# Patient Record
Sex: Female | Born: 2000 | Race: White | Hispanic: No | Marital: Single
Health system: Western US, Academic
[De-identification: ages and names within clinical notes are randomized; demographics above are authoritative.]

## PROBLEM LIST (undated history)

## (undated) DIAGNOSIS — I350 Nonrheumatic aortic (valve) stenosis: Secondary | ICD-10-CM

## (undated) DIAGNOSIS — Z972 Presence of dental prosthetic device (complete) (partial): Secondary | ICD-10-CM

## (undated) DIAGNOSIS — G43909 Migraine, unspecified, not intractable, without status migrainosus: Secondary | ICD-10-CM

## (undated) DIAGNOSIS — K219 Gastro-esophageal reflux disease without esophagitis: Secondary | ICD-10-CM

## (undated) DIAGNOSIS — Q231 Congenital insufficiency of aortic valve: Secondary | ICD-10-CM

## (undated) DIAGNOSIS — Q249 Congenital malformation of heart, unspecified: Secondary | ICD-10-CM

## (undated) HISTORY — PX: TOOTH EXTRACTION: SUR596

---
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
## 2021-01-30 NOTE — Telephone Encounter (Signed)
RN telephone both patient and mother and left a voice message to call [facility] at [phone] and RN also text messaged patient at [phone] using Outlook email with the following message:    Please call [facility] to Schedule an appointment with Cardiology at [phone].    Thanks,  Randall An RN, BSN (she/her)  Nurse Clinician  Cardiology Clinic  [facility]  [address]   [place], [place] [address]    Randall An RN, BSN  Cardiology Clinic

## 2021-02-15 DIAGNOSIS — F419 Anxiety disorder, unspecified: Secondary | ICD-10-CM | POA: Insufficient documentation

## 2021-03-07 ENCOUNTER — Other Ambulatory Visit: Payer: Self-pay

## 2021-03-07 ENCOUNTER — Encounter: Payer: Self-pay | Admitting: Gastroenterology

## 2021-03-07 ENCOUNTER — Ambulatory Visit (INDEPENDENT_AMBULATORY_CARE_PROVIDER_SITE_OTHER): Payer: Managed Care, Other (non HMO) | Admitting: Gastroenterology

## 2021-03-07 VITALS — BP 101/60 | HR 73 | Temp 99.1°F | Ht 67.0 in | Wt 161.2 lb

## 2021-03-07 DIAGNOSIS — Z791 Long term (current) use of non-steroidal anti-inflammatories (NSAID): Secondary | ICD-10-CM

## 2021-03-07 DIAGNOSIS — R1013 Epigastric pain: Secondary | ICD-10-CM

## 2021-03-07 DIAGNOSIS — G8929 Other chronic pain: Secondary | ICD-10-CM | POA: Diagnosis not present

## 2021-03-07 DIAGNOSIS — K5909 Other constipation: Secondary | ICD-10-CM

## 2021-03-07 NOTE — Patient Instructions (Signed)
High-Fiber Eating Plan °Fiber, also called dietary fiber, is a type of carbohydrate. It is found foods such as fruits, vegetables, whole grains, and beans. A high-fiber diet can have many health benefits. Your health care provider may recommend a high-fiber diet to help: °Prevent constipation. Fiber can make your bowel movements more regular. °Lower your cholesterol. °Relieve the following conditions: °Inflammation of veins in the anus (hemorrhoids). °Inflammation of specific areas of the digestive tract (uncomplicated diverticulosis). °A problem of the large intestine, also called the colon, that sometimes causes pain and diarrhea (irritable bowel syndrome, or IBS). °Prevent overeating as part of a weight-loss plan. °Prevent heart disease, type 2 diabetes, and certain cancers. °What are tips for following this plan? °Reading food labels ° °Check the nutrition facts label on food products for the amount of dietary fiber. Choose foods that have 5 grams of fiber or more per serving. °The goals for recommended daily fiber intake include: °Men (age 50 or younger): 34-38 g. °Men (over age 50): 28-34 g. °Women (age 50 or younger): 25-28 g. °Women (over age 50): 22-25 g. °Your daily fiber goal is _____________ g. °Shopping °Choose whole fruits and vegetables instead of processed forms, such as apple juice or applesauce. °Choose a wide variety of high-fiber foods such as avocados, lentils, oats, and kidney beans. °Read the nutrition facts label of the foods you choose. Be aware of foods with added fiber. These foods often have high sugar and sodium amounts per serving. °Cooking °Use whole-grain flour for baking and cooking. °Cook with brown rice instead of white rice. °Meal planning °Start the day with a breakfast that is high in fiber, such as a cereal that contains 5 g of fiber or more per serving. °Eat breads and cereals that are made with whole-grain flour instead of refined flour or white flour. °Eat brown rice, bulgur  wheat, or millet instead of white rice. °Use beans in place of meat in soups, salads, and pasta dishes. °Be sure that half of the grains you eat each day are whole grains. °General information °You can get the recommended daily intake of dietary fiber by: °Eating a variety of fruits, vegetables, grains, nuts, and beans. °Taking a fiber supplement if you are not able to take in enough fiber in your diet. It is better to get fiber through food than from a supplement. °Gradually increase how much fiber you consume. If you increase your intake of dietary fiber too quickly, you may have bloating, cramping, or gas. °Drink plenty of water to help you digest fiber. °Choose high-fiber snacks, such as berries, raw vegetables, nuts, and popcorn. °What foods should I eat? °Fruits °Berries. Pears. Apples. Oranges. Avocado. Prunes and raisins. Dried figs. °Vegetables °Sweet potatoes. Spinach. Kale. Artichokes. Cabbage. Broccoli. Cauliflower. Green peas. Carrots. Squash. °Grains °Whole-grain breads. Multigrain cereal. Oats and oatmeal. Brown rice. Barley. Bulgur wheat. Millet. Quinoa. Bran muffins. Popcorn. Rye wafer crackers. °Meats and other proteins °Navy beans, kidney beans, and pinto beans. Soybeans. Split peas. Lentils. Nuts and seeds. °Dairy °Fiber-fortified yogurt. °Beverages °Fiber-fortified soy milk. Fiber-fortified orange juice. °Other foods °Fiber bars. °The items listed above may not be a complete list of recommended foods and beverages. Contact a dietitian for more information. °What foods should I avoid? °Fruits °Fruit juice. Cooked, strained fruit. °Vegetables °Fried potatoes. Canned vegetables. Well-cooked vegetables. °Grains °White bread. Pasta made with refined flour. White rice. °Meats and other proteins °Fatty cuts of meat. Fried chicken or fried fish. °Dairy °Milk. Yogurt. Cream cheese. Sour cream. °Fats and   oils °Butters. °Beverages °Soft drinks. °Other foods °Cakes and pastries. °The items listed above may  not be a complete list of foods and beverages to avoid. Talk with your dietitian about what choices are best for you. °Summary °Fiber is a type of carbohydrate. It is found in foods such as fruits, vegetables, whole grains, and beans. °A high-fiber diet has many benefits. It can help to prevent constipation, lower blood cholesterol, aid weight loss, and reduce your risk of heart disease, diabetes, and certain cancers. °Increase your intake of fiber gradually. Increasing fiber too quickly may cause cramping, bloating, and gas. Drink plenty of water while you increase the amount of fiber you consume. °The best sources of fiber include whole fruits and vegetables, whole grains, nuts, seeds, and beans. °This information is not intended to replace advice given to you by your health care provider. Make sure you discuss any questions you have with your health care provider. °Document Revised: 09/01/2019 Document Reviewed: 09/01/2019 °Elsevier Patient Education © 2022 [employer] Inc[employer] ° °

## 2021-03-07 NOTE — Progress Notes (Signed)
Wyline Mood MD, MRCP(U.K) [address]  [address]  [place], [place] [address]  Main: [phone]  Fax: [phone]   Gastroenterology Consultation  Referring Provider:     Cyndia Diver, PA-C Primary Care Physician:  Pcp, No Primary Gastroenterologist:  Dr. Wyline Mood  Reason for Consultation:     Abdominal pain        HPI:   Emily Malone is a 20 y.o. y/o female referred for  Abdominal pain that began a week back. She is here to see me for abdominal pain which has been going on for a few months.  Describes it lower abdomen.  Associated with constipation.  Has not tried any over-the-counter medications for constipation.  She was previously tested for H. pylori by her primary care provider and treated with antibiotics.  Subsequently had a blood test which was positive and hence sent to [place].  She has a history of long-term NSAID use for migraines which were stopped a few weeks back.  History of marijuana use as well.  She has a history of chronic nausea as well.  She has been taking Prilosec 20 mg twice a day but taking it along with her meals.   03/04/2021 CBC, CMP were normal  No past medical history on file.  No past surgical history on file.  Prior to Admission medications   Medication Sig Start Date End Date Taking? Authorizing Provider  Cranberry 125 MG TABS Take by mouth.    [provider]  ondansetron (ZOFRAN ODT) 4 MG disintegrating tablet Take 1 tablet (4 mg total) by mouth every 8 (eight) hours as needed for nausea or vomiting. 06/03/20   Lucy Chris, PA    No family history on file.   Social History   Tobacco Use   Smoking status: Never    Passive exposure: Never   Smokeless tobacco: Never  Substance Use Topics   Alcohol use: Never    Allergies as of 03/07/2021 - Review Complete 03/07/2021  Allergen Reaction Noted   No known allergies  03/07/2021   Other Other (See Comments) 09/10/2012    Review of Systems:    All systems  reviewed and negative except where noted in HPI.   Physical Exam:  BP 101/60   Pulse 73   Temp 99.1 F (37.3 C) (Oral)   Ht 5\' 7"  (1.702 m)   Wt 161 lb 3.2 oz (73.1 kg)   BMI 25.25 kg/m  No LMP recorded. Psych:  Alert and cooperative. Normal mood and affect. General:   Alert,  Well-developed, well-nourished, pleasant and cooperative in NAD Head:  Normocephalic and atraumatic. Eyes:  Sclera clear, no icterus.   Conjunctiva pink. Ears:  Normal auditory acuity.. Lungs:  Respirations even and unlabored.  Clear throughout to auscultation.   No wheezes, crackles, or rhonchi. No acute distress. Heart:  Regular rate and rhythm; no murmurs, clicks, rubs, or gallops. Abdomen:  Normal bowel sounds.  No bruits.  Soft, non-tender and non-distended without masses, hepatosplenomegaly or hernias noted.  No guarding or rebound tenderness.    Neurologic:  Alert and oriented x3;  grossly normal neurologically. Psych:  Alert and cooperative. Normal mood and affect.  Imaging Studies: No results found.  Assessment and Plan:   Emily Malone is a 20 y.o. y/o female has been referred for abdominal pain.  Her history of abdominal pain is very suggestive of a combination of IBS constipation as well as possibly pain from use of marijuana which can cause aerophagia  as well as gastroparesis.  In addition she has been using NSAIDs long-term for migraines which can cause gastritis or peptic ulcer disease.  She has been taking Prilosec 20 mg twice a day but taking it in the improper manner as she has been taking it along with meals which renders it ineffective .   Plan 1.  H. pylori breath test in 6 weeks at next visit off PPI for a week  2.  Take omeprazole 30 min's before breakfast instead of after meals as she is taking presently.  3.  Stop Marijuana and excedrin use as it can cause gastroparesis, cannaboid hyperemesis syndrome and peptic ulcers.  4.  Commence on high fiber diet and daily miralax for  constipation. 5.  If no better at next visit then will consider EGD.   Follow up in 6 weeks   Dr Wyline Mood MD,MRCP(U.K)

## 2021-03-12 ENCOUNTER — Other Ambulatory Visit: Payer: Self-pay

## 2021-03-12 MED ORDER — OMEPRAZOLE 40 MG PO CPDR: 40.0000 mg | DELAYED_RELEASE_CAPSULE | Freq: Every day | ORAL | 1 refills | Status: DC

## 2021-03-12 NOTE — Telephone Encounter (Signed)
1. Takes atleast 1 -2 weeks for the PPI to work to 100%, check what dose she is on  2. If after 2-3 weeks still not better then will need EGD 3. Ensure not on any marijuana

## 2021-03-22 ENCOUNTER — Other Ambulatory Visit: Payer: Self-pay

## 2021-03-22 DIAGNOSIS — G8929 Other chronic pain: Secondary | ICD-10-CM

## 2021-03-22 NOTE — Telephone Encounter (Signed)
Sure lets go ahead with EGD

## 2021-03-26 ENCOUNTER — Telehealth: Payer: Self-pay | Admitting: Gastroenterology

## 2021-03-26 NOTE — Telephone Encounter (Signed)
Returning call to reschedule procedure

## 2021-03-27 ENCOUNTER — Encounter: Payer: Self-pay | Admitting: Gastroenterology

## 2021-03-27 ENCOUNTER — Encounter: Payer: Self-pay | Admitting: Anesthesiology

## 2021-03-27 NOTE — Telephone Encounter (Signed)
Patient was called back yesterday and she rescheduled her procedure to 04/08/2021.

## 2021-04-03 ENCOUNTER — Other Ambulatory Visit: Payer: Self-pay

## 2021-04-05 ENCOUNTER — Other Ambulatory Visit: Payer: Self-pay | Admitting: Gastroenterology

## 2021-04-08 ENCOUNTER — Ambulatory Visit
Admission: RE | Admit: 2021-04-08 | Payer: Managed Care, Other (non HMO) | Source: Home / Self Care | Admitting: Gastroenterology

## 2021-04-08 ENCOUNTER — Other Ambulatory Visit: Payer: Self-pay

## 2021-04-08 HISTORY — DX: Migraine, unspecified, not intractable, without status migrainosus: G43.909

## 2021-04-08 HISTORY — DX: Presence of dental prosthetic device (complete) (partial): Z97.2

## 2021-04-08 HISTORY — DX: Nonrheumatic aortic (valve) stenosis: I35.0

## 2021-04-08 HISTORY — DX: Congenital malformation of heart, unspecified: Q24.9

## 2021-04-08 HISTORY — DX: Gastro-esophageal reflux disease without esophagitis: K21.9

## 2021-04-08 HISTORY — DX: Congenital insufficiency of aortic valve: Q23.1

## 2021-04-08 SURGERY — ESOPHAGOGASTRODUODENOSCOPY (EGD) WITH PROPOFOL
Anesthesia: General

## 2021-04-08 MED ORDER — SUCRALFATE 1 G PO TABS: 1.0000 g | ORAL_TABLET | Freq: Four times a day (QID) | ORAL | 0 refills | Status: DC

## 2021-04-24 ENCOUNTER — Ambulatory Visit: Admit: 2021-04-25 | Payer: PRIVATE HEALTH INSURANCE | Attending: Physician | Primary: Physician

## 2021-04-24 DIAGNOSIS — R3989 Other symptoms and signs involving the genitourinary system: Secondary | ICD-10-CM

## 2021-04-24 MED ORDER — HYDROXYZINE HCL 25 MG TABLET
25 | ORAL_TABLET | Freq: Every day | ORAL | 0 refills | 30.00000 days | Status: AC
Start: 2021-04-24 — End: 2022-02-16

## 2021-04-24 NOTE — Progress Notes (Signed)
UROGYNECOLOGY   New Patient - Telehealth Visit  Consulted by None Per Patient Provider   For: recurrent bladder pain        Subjective    Stephanie Kramer is a 20 y.o. female who presents with the following:       "Stephanie Kramer"  Patient accompanied by mother, [name]       History of Present Illness   Bladder problems since IUD placement at age 8  Had IUD removed and symptoms persisted  Feels like she has a urinary tract infection every time she drinks fizzy drinks or after intercourse  Bladder pain constantly  Has to use AZO regularly  Also drinking bladder health drinks and vaginal wash ([place])    Voiding 2-3x/day  No nocturia     Occasional constipation  Recent h.pylori - treated  Endorses high levels of anxiety    Hematuria with culture proven urinary tract infection  No hematuria otherwise  No history of stones    Sexually active  No STI history of    BCM is OCP  Menses regular on OCP    Review of EHR:   11/19/20 - NG  11/07/20 - NG  10/17/20 - NG  10/13/20 - 100K Enterococcus  11/03/19 - urinalysis reviewed and unremarkable   09/06/19 - urinalysis reviewed and unremarkable   12/11/16 - urogenital flora  01/08/16 - proteus, e.coli    PSH neg  PMH mild aortic stenosis, lyme disease    Renal US 11/07/19 - PVR 62ml  Abd [place] 09/10/16 - no hydronephrosis       Prior urogynecologic procedures/treatments:   None     I personally reviewed, interpreted, and summarized her outside hospital records below (including relevant lab/imaging results):  Labs     Patient's goals: less pain    Ob Hx:  G0    Gyn Hx:  She is premenopausal, regular mense  Sexually active, and denies dyspareunia   Cervical cancer screening not reviewed    Allergies/Contraindications   Allergen Reactions    Gluten Protein Other (See Comments)     Stomachache, sharp pains     Outpatient Encounter Medications as of 04/24/2021   Medication Sig Dispense Refill    albuterol 90 mcg/actuation metered dose inhaler Inhale 2 puffs into the lungs every 4 (four) hours as needed  for Wheezing (Patient not taking: Reported on 12/21/2020) 1 each 3    cetirizine (ZYRTEC) 10 mg tablet Zyrtec Allergy 10 MG (Patient not taking: Reported on 12/21/2020)      cholecalciferol, vitamin D3, 1000 UNITS tablet Take 1,000 Units by mouth daily (Patient not taking: Reported on 12/21/2020)      cranberry fruit extract (ELLURA ORAL) Take by mouth (Patient not taking: Reported on 12/21/2020)      estradioL (ESTRACE) 0.01 % (0.1 mg/gram) vaginal cream Apply 0.5gm intravaginally and periurethrally nightly for 14days then 2 times per week. (Patient not taking: Reported on 12/21/2020) 42.5 g 0    fluticasone propionate (FLONASE) 50 mcg/actuation nasal spray Flonase Allergy Relief 50 MCG/ACT (Patient not taking: Reported on 12/21/2020)      inhalational spacing device (BREATHERITE VALVED MDI SPACER) inhaler Use as instructed (Patient not taking: Reported on 12/21/2020) 1 each 0    LO LOESTRIN FE 1 mg-10 mcg (24)/10 mcg (2) tablet  (Patient not taking: Reported on 12/21/2020)      nitrofurantoin (MACRODANTIN) 50 mg capsule Take 1 tab po prn after intercourse. Take with food (Patient not taking: Reported on 12/21/2020) 28 capsule 0  nitrofurantoin, macrocrystal-monohydrate, (MACROBID) 100 mg capsule TAKE 1 CAPSULE BY MOUTH EVERY 12 HOURS FOR 5 DAYS (Patient not taking: No sig reported)      phenazopyridine (PYRIDIUM) 100 mg tablet Take 1 tablet by mouth 3 times a day as needed for 2 days. (Patient not taking: No sig reported)      predniSONE (DELTASONE) 20 mg tablet 2 tablets (Patient not taking: No sig reported)      sertraline (ZOLOFT) 100 mg tablet  (Patient not taking: Reported on 12/21/2020)      triamcinolone (KENALOG) 0.1 % ointment  (Patient not taking: No sig reported)       No facility-administered encounter medications on file as of 04/24/2021.     Past Medical History:   Diagnosis Date    Encounter for immunization     Influenza vaccine needed  (02/23/2009)       No past surgical history on file.  No  family history on file.    Social History     Tobacco Use    Smoking status: Never    Smokeless tobacco: Never       I reviewed the patient's pertinent allergies, medications past medical, social, and family history in the electronic health record and made updates as appropriate.         Allergic/Immune: negative for environmental or food allergies  Cardiovascular: negative for chest pain  Respiratory: negative for cough and shortness of breath  Constitutional: negative for decreased energy level, denies fevers or chills  Ears Nose Throat: negative for ENT problems  Endocrine: negative for unexpected weight loss or gain, temperature intolerance  Eyes: negative for eye problems, dry eyes  Gastrointestinal: negative for blood in stools  Hemat/Lymph: negative for easy bruising, swollen lymph node(s)  Skin: negative for rash  Musculoskeletal: negative for myalgia  Neurological: negative for weakness, memory problems  Psychiatric: negative for depression, anxiety  Genitourinary: endorses for dysuria                  Objective    PHYSICAL EXAMINATION:  No vitals given telehealth consultation visit.  Constitutional: Well-developed, well-nourished, and in no distress.   Psychiatric: Her behavior is normal   Head: Normocephalic and atraumatic.   Eyes: Conjunctivae are normal.   Neck: Observed range of motion is normal.    Pulmonary/Chest: Effort normal.   Musculoskeletal: Normal range of motion.   Neurological: She is alert and oriented to person, place, and time.   Skin: Visible skin is without rash.          Assessment and Plan    [name] Sen y.o. with history of recurrent dysuria with either recurrent urinary tract infections or interstitial cystitis/bladder pain syndrome.    INTERSTITIAL CYSTITIS/BLADDER PAIN SYNDROME: Discussed diagnosis, pathophysiology and management options with patient including first-line therapy with behavioral and dietary modification (general relaxation, stress reduction, pain management with  NSAIDs, acetaminophen, phenazopyridine) and second-line therapy with oral medications (amitriptyline, hydroxyzine, cimetidine), intravesical instillations series, or pelvic floor physical therapy.     Recommend stress reduction, mindfulness/meditation practices, and avoiding triggers. Recommend avoiding constipation. Given patient's college in [place] will not be able to complete bladder instillations or pelvic floor physical therapy while home for the next two weeks. Offered medication managament and patient is amenable. Prescription for hydroxyzine sent to pharmacy. Side effect profile reviewed.            I spent a total of 38 minutes on this patient's care on the day of their visit excluding  time spent related to any billed procedures. This time includes time spent with the patient as well as time spent documenting in the medical record, reviewing patient's records and tests, obtaining history, placing orders, communicating with other healthcare professionals, counseling the patient, family, or caregiver, and/or care coordination for the diagnoses above.      I performed this evaluation using real-time telehealth tools, including a live video Zoom connection between my location and the patient's location. Prior to initiating, the patient consented to perform this evaluation using telehealth tools.                      Electronically signed:  Christean Grief, MD  Division of Urogynecology  Department of Obstetrics & Gynecology & Reproductive Sciences

## 2021-04-26 ENCOUNTER — Ambulatory Visit
Admit: 2021-04-29 | Payer: PRIVATE HEALTH INSURANCE | Attending: Student in an Organized Health Care Education/Training Program | Primary: Physician

## 2021-04-26 DIAGNOSIS — R1013 Epigastric pain: Secondary | ICD-10-CM

## 2021-04-26 MED ORDER — OMEPRAZOLE 20 MG CAPSULE,DELAYED RELEASE
20 | ORAL | Status: AC | PRN
Start: 2021-04-26 — End: 2022-03-01

## 2021-04-26 MED ORDER — CALCIUM CARBONATE 200 MG CALCIUM (500 MG) CHEWABLE TABLET
500 | ORAL | Status: AC | PRN
Start: 2021-04-26 — End: ?

## 2021-04-26 NOTE — Progress Notes (Addendum)
Stephanie Kramer is a 20 y.o. female seen via telehealth for a new visit consultation with chief complaint of Abdominal Pain, Other specified bacterial intestinal infections, and Nausea   at the request of Steward Drone, MD.    I performed this evaluation using real-time telehealth tools, including a live video connection between my location and the patient's location. Prior to initiating, I obtained informed verbal consent to perform this evaluation using telehealth tools and answered all the questions about the telehealth interaction. My location is not in a [facility] clinical facility.     History of Present Illness  This is a 20 y.o. female with history of anxiety who is referred for 6 months of abdominal pain, nausea/vomiting, and diarrhea.     Referral Comment: "epigastric, pain nausea, and H. Pylori Infection"    She reports that symptoms started in July prior to a trip to [place]. She first noticed some nausea and epigastric pain which she attributed to anxiety. While in [place] she got a viral stomach illness with n/v, fever, and myalgias. She returned from her trip and her symptoms persisted. She states she wakes up with sharp epigastric abdominal pain and frequently vomits first thing in the morning. Vomit is non-bloody non-bilious. She reports 25lbs of weight loss since July. Has a poor appetite due to nausea.     She also endorses intermittent diarrhea over the same time course. No melena or hematochezia. She denies persistent fevers or arthralgias. Reports occasional chills, cold intolerance, and HA.     She was treated for H.pylori in August after positive serum IgM. Seen by GI in [place] who recommended omeprazole 32m every day, miralax, and high fiber diet.    She feels here symptoms are slightly improved on omeprazole.    Diagnostics to date:  - H.pylori + IgM 01/04/2021 (treated with amoxicillin 500 mg tablet, clarithromycin 500 mg tablet, omeprazole 20 mg DR capsule --> did not tolerate d/t  n/v/weight loss, switched to quad therapy)  - tolerated quad therapy well with resolution of sxs. Symptoms recurred 03/04/2021 so was retested for H.pylori and found to have +IgM 03/06/2021  - Has never had stool Ag testing or H.pylori breath test  - TtG Iga 10/26 negative    Past Medical History:  The patient has a past medical history of Encounter for immunization.    Past Surgical History:  The patient  has no past surgical history on file.    Medications:  albuterol  BREATHERITE VALVED MDI SPACER Spacer  calcium carbonate  cetirizine  cholecalciferol (vitamin D3)  ELLURA ORAL  estradioL  fluticasone propionate  hydrOXYzine  LO LOESTRIN FE Tab  nitrofurantoin  nitrofurantoin (macrocrystal-monohydrate)  omeprazole  phenazopyridine  predniSONE  sertraline  triamcinolone    Social History:   reports that she has never smoked. She has never used smokeless tobacco.    Family History:  Mother- Hashimoto's thyroiditis    Physical Exam:  Observed via video:  Constitutional: Alert and interactive. No apparent distress.   Pulm: breathing comfortably  Neuro: moving UE without issue, able to manage video device     I have personally reviewed the labs, imaging and other diagnostics below:   Latest [facility]  09/06/19 Cr 0.74, albumin 4.0, AST 12, ALT 14, alk phos 57, Tbili 0.8    Latest Outside Labs  - H.pylori + IgM 01/04/2021, 03/06/2021  - TtG Iga 10/26 negative    Latest Imaging and Other Diagnostics  None  Assessment and Plan  Visit Diagnoses:  1. Epigastric pain    2. Non-intractable vomiting with nausea, unspecified vomiting type    3. Diarrhea, unspecified type        Ms. Stephanie Kramer is a 20 year old female with a history of anxiety and recent treatment for H.Pylori (following positive H pylori IgM only), who presents with epigastric pain, recurrent vomiting, and intermittent diarrhea    #Episodic vomiting  #Nausea  #Epigastric pain  Ongoing nausea, epigastric pain, and non-bloody vomiting for 6 months. Patient  has been treated for H.Pylori (based on positive serum IgM only which could have been false positive) with initial improvement in symptoms. Had recurrence of symptoms and was retested with serum antibody testing for H.Pylori, which is not the gold standard for diagnosis of H.Pylori infection. Differential includes cyclical vomiting syndrome/abdominal migraine, gastroparesis, marijuana induced hyperemesis (but reports symptoms persisted despite stopping marijuana use), GERD, PUD. Given significant weight loss and persistent severe symptoms, recommend upper endoscopy at this time. Would also obtain gastric emptying study to evaluate for gastroparesis. Patient is concerned that symptoms will worsen if she needs to stop omeprazole for H.Pylori testing, at this time reasonable to pursue other diagnostics and wait to see if some improvement prior to pursuing re-testing.    #Intermittent diarrhea  Patient reports 6 months of intermittent non-bloody diarrhea associated with 25 lbs of weight loss. Also with intermittent constipation but diarrhea has predominated over last several months. Differential includes IBS, IBD (given family history of autoimmune disease and weight loss), lactose intolerance, Alpha-gal meat allergy syndrome (given lives in [place]), other food sensitivity, Giardia. Recent celiac serologies negative. Recommend sending Giardia PCR, fecal calprotectin, and Alpha-gal IgE. If fecal calprotectin positive, patient will also need colonoscopy    Summary of Recommendations:   - Fecal calprotectin, Girardia PCR, Alpha-Gal IgE  - Gastric emptying study  - Upper Endoscopy  - If fecal calprotectin +, recommend colonoscopy at time of upper endoscopy  - Continue omeprazole, can consider repeat testing for H.Pylori after trial off omeprazole when patient feels ready  - Start psyllium daily for IBD-D symptoms    Patient will follow up with PCP and Gastroenterologist in [place]    An after visit summary was provided  to the patient.     I reviewed external records from 2 providers outside my specialty or healthcare organization as summarized in the note.      CC: Steward Drone, MD    Answers for HPI/ROS submitted by the patient on 04/23/2021  Have you had poor oral intake for 4 or more days?: Yes  Have you had a recent loss of appetite?: Yes  Do you have difficulty chewing?: No  Do you have difficulty swallowing?: No  Change in Gas: Yes  Blood in your stool: No  Black tarry or sticky bowel movements: Yes  False alarm sensations: No  Pain associated with swallowing: No  Regurgitation: Yes  Problems with liquids: No  Do you get full quickly when eating?: Yes  Does food stick when swallowing?: No  heartburn: No  Has your problem swallowing gotten progressively worse?: No  muscle aches: No  myalgias: No  arthralgias: No  back pain: Yes  Chest Pain: No  headaches: No  dizziness: Yes  Depression: Yes  Feeling too cold: Yes  Feeling too hot: Yes  Nervousness or Anxiety: Yes  Insomnia: Yes  Rash: No  Hair Loss: Yes  fever: Yes  chills: No  sweats: Yes  Easy  bruising or bleeding: No  difficulty breathing: No

## 2021-04-26 NOTE — Patient Instructions (Addendum)
Dear Stephanie Kramer,    It was nice to meet you today. Please see a summary of our visit below.    For your upper abdominal symptoms (pain, vomiting), some potential causes include gastroparesis (slow stomach emptying), cyclic vomiting syndrome/abdominal migraine, marijuana hyperemesis, stomach ulcer.  - Gastric emptying study to evaluate for gastroparesis  - We recommend an Endoscopy to visualize the lining of the stomach  - Continue omeprazole daily. We can consider re-testing for H.pylori in a few months if you are feeling better and think you can tolerate     For your lower abdominal symptoms (diarrhea, constipation), the most common cause is irritable bowel syndrome. Other potential causes include infection, food intolerance, meat allergy syndrome, or inflammatory bowel disease.   - We will send some stool tests to evaluate for these potential causes (fecal calprotectin, giardia PCR, Alpha-Gal IgE), please go to the [facility] to get these tests done.  - If these tests are abnormal we would recommend also doing a colonoscopy.   - Start Metamucil Fiber (psyllium) one flat teaspoon one time per day with a meal; this helps to normalize the bowel habits    Best,    Dr. Latricia Heft  Dr. Nathanial Rancher

## 2021-04-30 ENCOUNTER — Other Ambulatory Visit: Payer: Self-pay | Admitting: Gastroenterology

## 2021-05-09 ENCOUNTER — Ambulatory Visit: Payer: Managed Care, Other (non HMO) | Admitting: Gastroenterology

## 2021-05-14 ENCOUNTER — Other Ambulatory Visit: Payer: Self-pay

## 2021-05-14 LAB — ALPHA-GAL IGE PANEL
Alpha-Gal (galactose-alpha-1,3-galactose) IgE: <0.1 kU/L
Beef (Food) IgE: <0.1 kU/L
Lamb (Food) IgE: <0.1 kU/L
Pork IgE: <0.1 kU/L
Total IgE, Serum: 71 IU/mL (ref 6–495)

## 2021-05-15 ENCOUNTER — Encounter: Payer: Self-pay | Admitting: Gastroenterology

## 2021-05-15 ENCOUNTER — Ambulatory Visit (INDEPENDENT_AMBULATORY_CARE_PROVIDER_SITE_OTHER): Payer: Managed Care, Other (non HMO) | Admitting: Gastroenterology

## 2021-05-15 VITALS — BP 103/72 | HR 74 | Temp 99.1°F | Wt 154.0 lb

## 2021-05-15 DIAGNOSIS — R1013 Epigastric pain: Secondary | ICD-10-CM

## 2021-05-15 DIAGNOSIS — Z8619 Personal history of other infectious and parasitic diseases: Secondary | ICD-10-CM

## 2021-05-15 DIAGNOSIS — G8929 Other chronic pain: Secondary | ICD-10-CM | POA: Diagnosis not present

## 2021-05-15 LAB — CALPROTECTIN, FECAL: Calprotectin, Fecal: 51 ug/g (ref 0–120)

## 2021-05-15 NOTE — Progress Notes (Signed)
Jonathon Bellows MD, MRCP(U.K) [address]  [place]  [place], [place] [address]  Main: [phone]  Fax: [phone]   Primary Care Physician: Pcp, No  Primary Gastroenterologist:  Dr. Jonathon Bellows   Chief complaint: Follow-up for abdominal pain  HPI: Emily Malone is a 21 y.o. female   Summary of history :  She was initially referred and seen on 03/07/2021 for abdominal pain.  That been ongoing on for a few months.  Associated constipation.  Previously tested for H. pylori by her PCP and treated with antibiotics.  Subsequently had a blood test which was positive and then sent to [place].  History of NSAID use for migraines which she had stopped.  History of marijuana use as well.  Plan at the last visit was for H. pylori breath test 6 weeks after the visit to stop using marijuana and Excedrin.  High-fiber diet and if no better at the next visit was to consider an EGD.  Interval history 03/07/2021-05/15/2021  She is scheduled for an EGD but then canceled the procedure.  She had a telemedicine appointment with [facility] gastroenterology.  They to recommended EGD patient had some diarrhea at that point of time and hence recommended a colonoscopy 2. After last visit she states she will started on Lexapro for anxiety and since then found significant improvement in her GI and non-GI symptoms.  Initially had lost weight but since then has improved.  She is taking her Prilosec daily.  No diarrhea.  No acute issues.  Current Outpatient Medications  Medication Sig Dispense Refill   cetirizine (ZYRTEC) 10 MG tablet Take 1 tablet by mouth daily.     Cranberry 125 MG TABS Take by mouth.     escitalopram (LEXAPRO) 10 MG tablet Take 10 mg by mouth daily.     LO LOESTRIN FE 1 MG-10 MCG / 10 MCG tablet Take 1 tablet by mouth daily.     omeprazole (PRILOSEC) 20 MG capsule Take 1 capsule by mouth in the morning and at bedtime.     No current facility-administered medications for this visit.     Allergies as of 05/15/2021   (No Known Allergies)    ROS:  General: Negative for anorexia, weight loss, fever, chills, fatigue, weakness. ENT: Negative for hoarseness, difficulty swallowing , nasal congestion. CV: Negative for chest pain, angina, palpitations, dyspnea on exertion, peripheral edema.  Respiratory: Negative for dyspnea at rest, dyspnea on exertion, cough, sputum, wheezing.  GI: See history of present illness. GU:  Negative for dysuria, hematuria, urinary incontinence, urinary frequency, nocturnal urination.  Endo: Negative for unusual weight change.    Physical Examination:   There were no vitals taken for this visit.  General: Well-nourished, well-developed in no acute distress.  Eyes: No icterus. Conjunctivae pink. Neuro: Alert and oriented x 3.  Grossly intact. Skin: Warm and dry, no jaundice.   Psych: Alert and cooperative, normal mood and affect.   Imaging Studies: No results found.  Assessment and Plan:   Emily Malone is a 21 y.o. y/o female here to follow-up for abdominal pain, history of NSAID use, marijuana use.  At her last visit was advised to stop marijuana and NSAIDs and the pain persisted despite a trial of PPI to proceed with EGD.  She had been scheduled for appointment and canceled the procedure.  Subsequently had a televisit appointment with another gastroenterologist in [place].  Recommendations are similar to what we had recommended.  Since her last visit she states all her  GI symptoms have resolved after commencing on Lexapro for her anxiety.  I explained to her since she has no symptoms at this point of time I would not recommend any endoscopy procedures.  If the symptoms were to recur we could consider endoscopy and colonoscopy. Plan 1.  H. pylori breath test to confirm eradication 1 week off PPI which she will stop today and, and have a test at our office in a week.   Follow-up in 6 to 8 weeks if symptoms recur we will perform further  testing.  Dr Jonathon Bellows  MD,MRCP [facility]) Follow up in 6 to 8 weeks

## 2021-05-15 NOTE — Patient Instructions (Addendum)
Please come back to the clinic on the times that were provided to you. You could also go to [facility] off of [address] and [address].  1 week off PPI which you are to stop today and then have a test at our office in a week.

## 2021-06-08 LAB — H. PYLORI BREATH TEST: H pylori Breath Test: NEGATIVE

## 2021-07-17 ENCOUNTER — Ambulatory Visit: Payer: Managed Care, Other (non HMO) | Admitting: Gastroenterology

## 2022-09-05 IMAGING — CR DG WRIST COMPLETE 3+V*R*
4 series · 4 of 4 positions shown · non-contrast
Comparison: None.

CLINICAL DATA: Fall, pain

EXAM:
RIGHT WRIST - COMPLETE 3+ VIEW

[wrist pa]
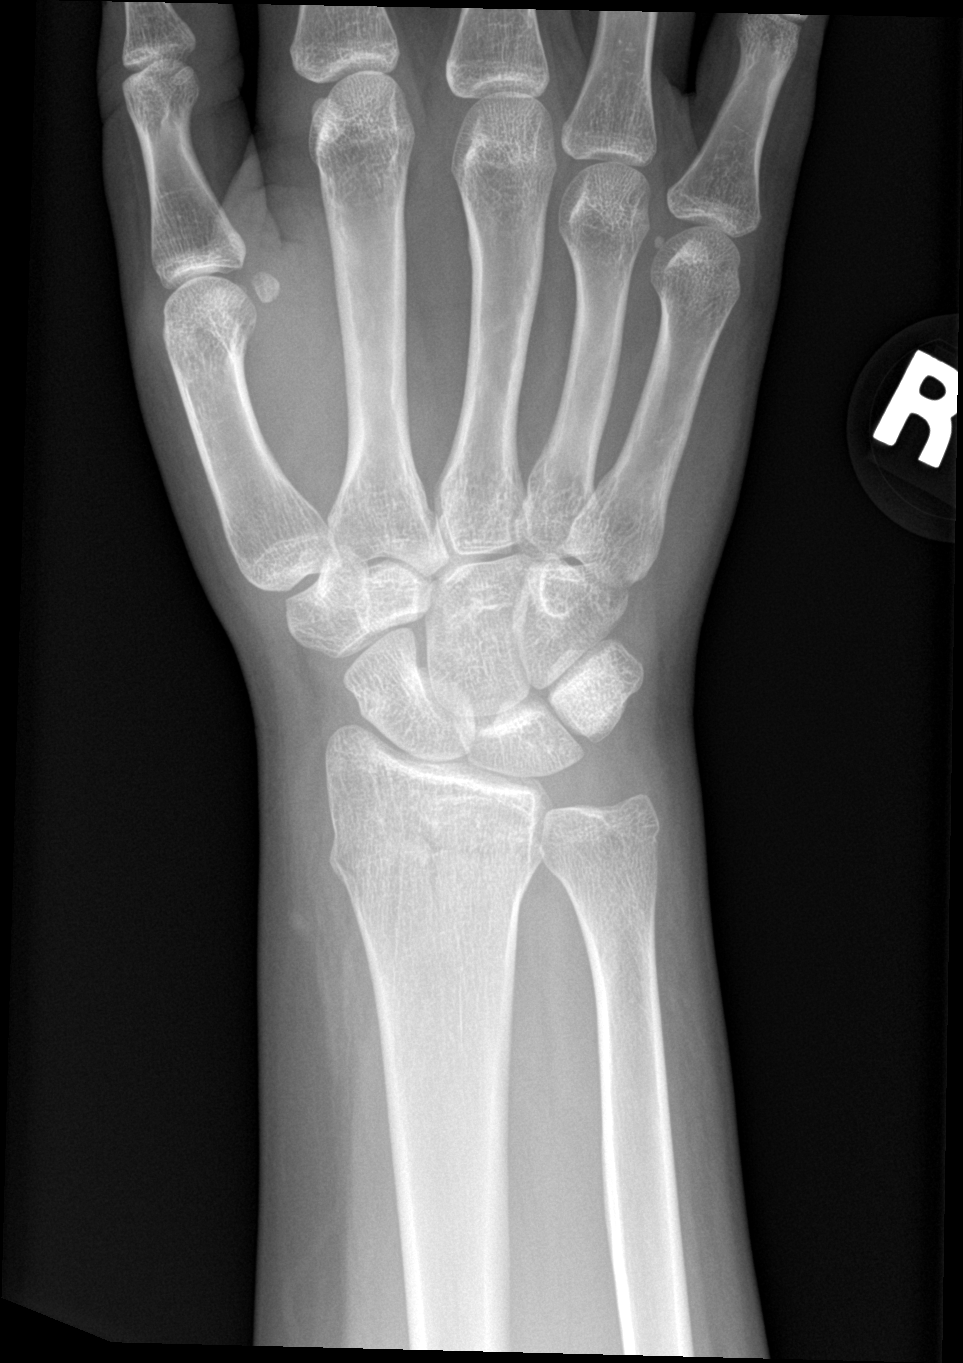

[wrist obl]
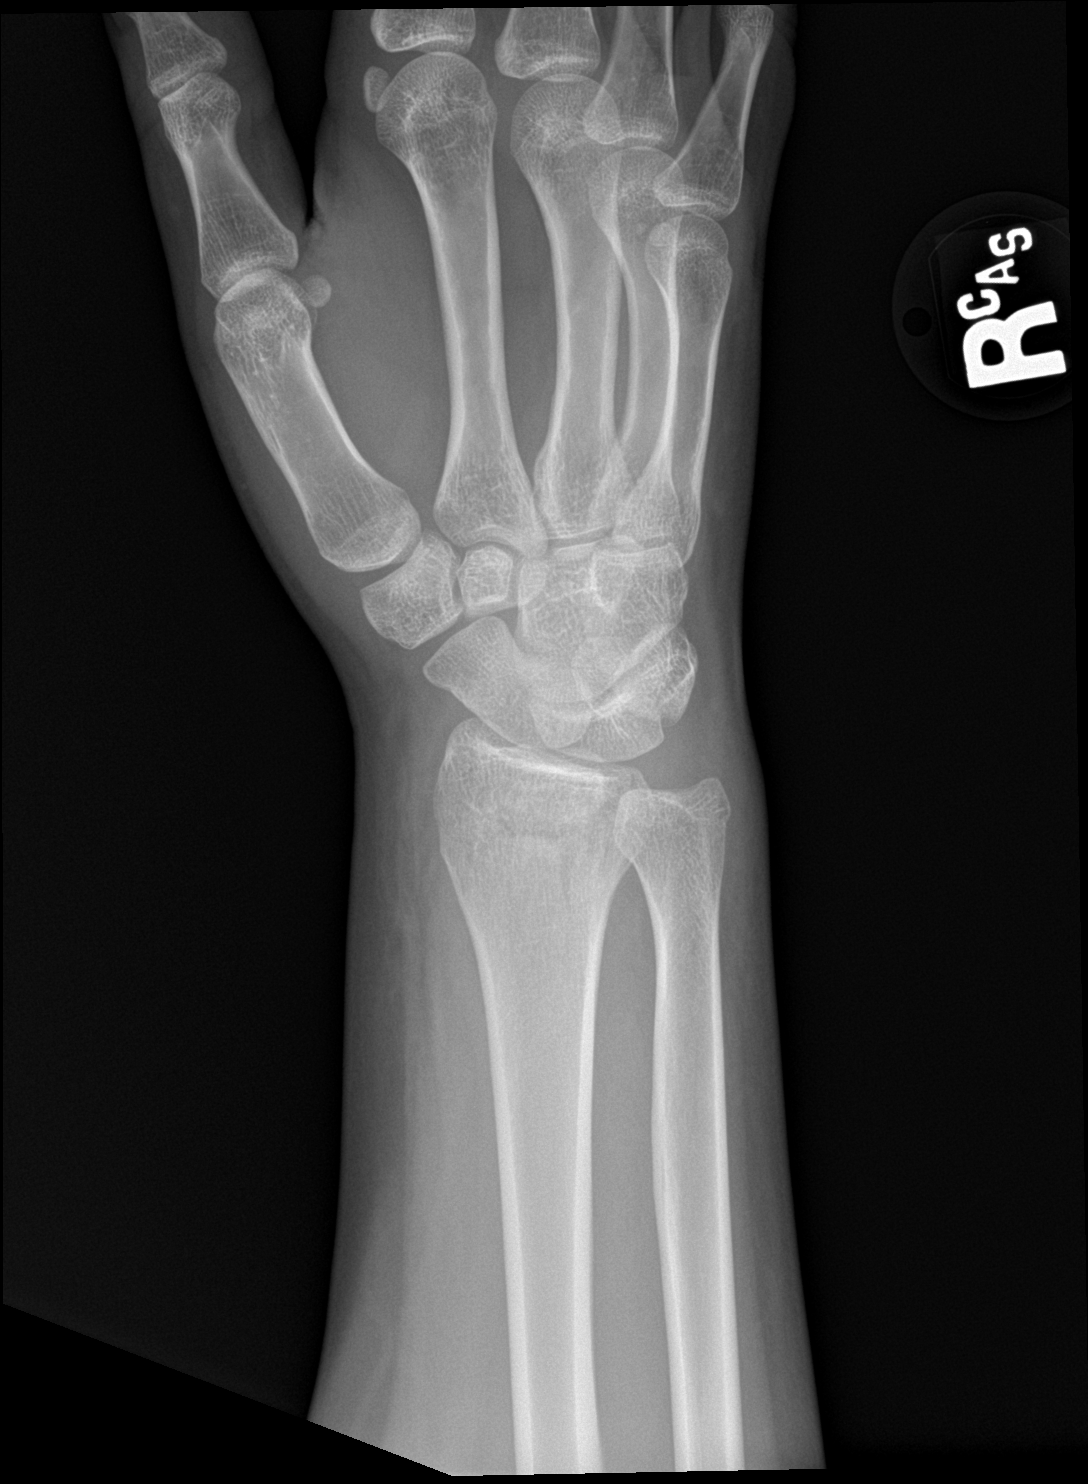

[wrist lat]
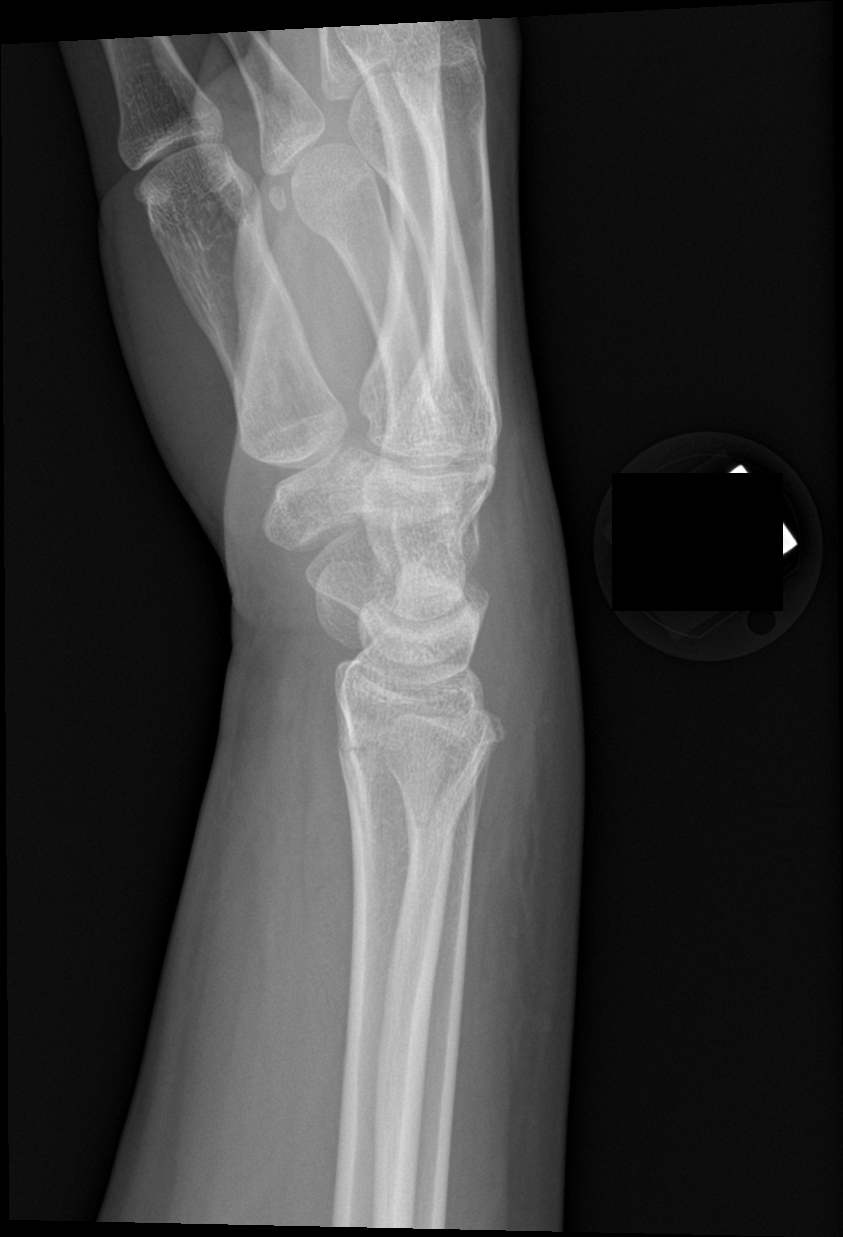

[navicular]
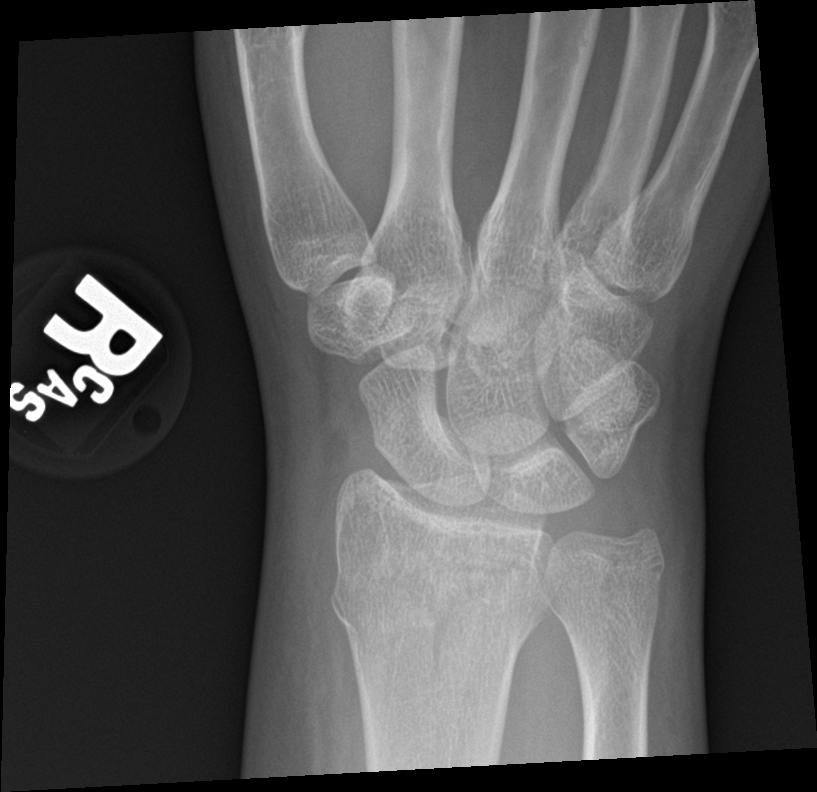

[4 of 4 positions shown; findings below may reference images not displayed]

FINDINGS: Nondisplaced fracture involving the distal radial metaphysis. No
definite intra-articular extension.

The joint spaces are preserved.

Mild dorsal soft tissue swelling.
IMPRESSION: Nondisplaced fracture involving the distal radial metaphysis.

## 2022-12-05 MED ADMIN — acetaminophen (TYLENOL) tablet 1,000 mg: 1000 mg | ORAL | @ 13:00:00 | NDC 00904673061

## 2022-12-05 MED ADMIN — ibuprofen (ADVIL,MOTRIN) tablet 600 mg: 600 mg | ORAL | @ 13:00:00 | NDC 00904585461

## 2022-12-05 MED FILL — ACETAMINOPHEN 500 MG TABLET: 500 mg | ORAL | Qty: 2

## 2022-12-05 MED FILL — IBUPROFEN 600 MG TABLET: 600 mg | ORAL | Qty: 1

## 2023-10-12 ENCOUNTER — Ambulatory Visit
Admit: 2023-10-12 | Discharge: 2023-10-19 | Payer: PRIVATE HEALTH INSURANCE | Attending: Cardiovascular Disease | Primary: Physician

## 2023-10-12 DIAGNOSIS — R002 Palpitations: Secondary | ICD-10-CM

## 2023-10-12 DIAGNOSIS — Q2381 Bicuspid aortic valve: Secondary | ICD-10-CM

## 2023-10-12 NOTE — Patient Instructions (Addendum)
 It was a pleasure to meet you today!     We would like to get an echo and a cardiac MRI to follow up on your bicuspid valve and assess aorta in its entirety. It is unlikely that the results from either of these tests will limit your ability to go on the [place] trip, but we will confirm this after the results are back.     It is fine to get these tests done in [place] if preferred, please ask the center for a CD copy of the images of the test that we can upload to our system.     If you decide to get the cardiac MRI done at [facility] - please call radiology scheduling in about a week at [phone].

## 2023-10-12 NOTE — Progress Notes (Signed)
 Subjective    Stephanie Kramer is a 23 y.o. female who presents with the following:          History of Present Illness     Stephanie Kramer is a 23 y.o. 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     She was diagnosed as a baby after a thorough physical exam noted a murmur which lead to an echocardiogram. Previously followed by Dr Elwyn, last TTE in 2022 showed mild AS with trace AR. No coarctation noted on echos. She has never been symptomatic or had limitations in her exercise ability, she always kept up with other kids growing up and now exercises regularly.  She does heavy weight lifting, currently lifting up to 180lbs with the goal to get to 300lbs.     She is hoping to hike [place] up to [facility] and wants to ensure this is safe with her heart condition. 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      Allergies/Contraindications   Allergen Reactions    Gluten Protein Other (See Comments)     Stomachache, sharp pains     Outpatient Encounter Medications as of 10/12/2023   Medication Sig Dispense Refill    escitalopram oxalate (LEXAPRO) 10 mg tablet Take 1 tablet (10 mg total) by mouth daily      calcium  carbonate (TUMS) 500 mg (200 mg elemental) chewable tablet Chew 1 tablet by mouth As Needed for Heartburn (Patient not taking: Reported on 10/12/2023)      cetirizine (ZYRTEC) 10 mg tablet  (Patient not taking: Reported on 10/12/2023)      LO LOESTRIN FE  1 mg-10 mcg (24)/10 mcg (2) tablet Take 1 tablet by mouth daily (Patient not taking: Reported on 10/12/2023) 30 tablet 3     No facility-administered encounter medications on file as of 10/12/2023.     Past Medical History:   Diagnosis Date    Encounter for immunization     Influenza vaccine needed  (02/23/2009)       No past surgical history on file.  No family history on file.    Social History     Tobacco Use     Smoking status: Never    Smokeless tobacco: Never           Objective      Vitals      Flowsheet Row Most Recent Value   BP 143/91   Heart Rate 59   Temp 36.7 C (98 F)   Temp Source Temporal   SpO2 97 %   Weight 71.7 kg (158 lb)   Height 170.2 cm (5\' 7" )   BMI (Calculated) 24.8              Physical Exam    GENERAL: Alert, well appearing, and in no acute distress.  EYES: Normal conjunctivae, no scleral icterus.    RESPIRATORY: No respiratory distress. CTAB.  No wheezes or rhonchi.   CARDIOVASCULAR: Regular rhythm. Normal S1, S2. No murmurs, rubs, clicks or gallops.  GI/ABDOMEN: Soft, nontender, nondistended and no organomegaly.  EXTREMITIES: No clubbing, cyanosis or edema.  SKIN: No clubbing or cyanosis; warm, well-perfused.    NEUROLOGIC: Alert and oriented to person, place, and situation.  Normal gait.  PSYCHIATRIC: Normal speech and affect.       Review of Prior Testing  DATA:  I  have personally reviewed the following data:     TTE (12/21/2020) peds cards server   Summary:   1. The aortic valve is bicuspid with fusion of the right and non-coronary cusps.   2. Trivial aortic regurgitation.   3. Normal left ventricular systolic function.   4. Mild dilatation of the ascending aorta.   5. No significant change from previous study.  Aortic Valve: The aortic valve is bicuspid aortic with fusion of the right and non-coronary cusps. There is trivial aortic stenosis. Trivial aortic regurgitation is present.   Aorta          Diam Z Score    Ao Root    2.5 cm  -1.04  Asc Aorta 3.10 cm     2.7       Assessment and Plan         Stephanie Kramer is a 22 y.o. 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 Due for follow up interval imaging, will plan for echo every 2-3 years to monitor the  valve. The ascending aorta/arch was seen well on TTE without evidence of coarcataion, will get one time CMR to assess aorta in its entirety. Mild dilation of the ascending aorta on TTE from 2022, z-score at that time 2.7, though at 3.1cm at age 78 this is technically within normal adult range.   - routine TTE  - CMR to eval coarctation     Health Maintenance   - recommend minimum exercise/wk, if no evidence of coarctation no limitation on weight lifting.   - does not need SBE ppx   - continued healthy eating habits        I have provided the following written instructions to the patient in the After Visit Summary:   Patient Instructions   It was a pleasure to meet you today!     We would like to get an echo and a cardiac MRI to follow up on your bicuspid valve and make sure there is no evidence of coarctation. It is unlikely that the results from either of these tests will limit your ability to go on the [place] trip, but we will confirm this after the results are back.     It is fine to get these tests done in [place] if preferred, please ask the center for a CD copy of the images of the test that we can upload to our system.     If you decide to get the cardiac MRI done at [facility] - please call radiology scheduling in about a week at [phone].       Patient seen & discussed with attending physician Dr. Pierce.    Stephanie Salines, MD  Cardiology Clinical Fellow  [facility]  [facility]

## 2023-10-19 LAB — ECG 12-LEAD
Atrial Rate: 57 {beats}/min
Calculated P Axis: 61 degrees
Calculated R Axis: 70 degrees
Calculated T Axis: 28 degrees
P-R Interval: 144 ms
QRS Duration: 80 ms
QT Interval: 392 ms
QTcb: 381 ms
Ventricular Rate: 57 {beats}/min
